# Patient Record
Sex: Female | Born: 1965 | Race: Black or African American | Hispanic: No | Marital: Single | Smoking: Never smoker
Health system: Southern US, Community
[De-identification: ages and names within clinical notes are randomized; demographics above are authoritative.]

## PROBLEM LIST (undated history)

## (undated) DIAGNOSIS — I10 Essential (primary) hypertension: Secondary | ICD-10-CM

## (undated) DIAGNOSIS — E785 Hyperlipidemia, unspecified: Secondary | ICD-10-CM

## (undated) HISTORY — DX: Hyperlipidemia, unspecified: E78.5

## (undated) HISTORY — DX: Essential (primary) hypertension: I10

## (undated) HISTORY — PX: ABDOMINAL HYSTERECTOMY: SHX81

---

## 1998-01-17 ENCOUNTER — Other Ambulatory Visit: Admission: RE | Admit: 1998-01-17 | Discharge: 1998-01-17 | Payer: Self-pay | Admitting: Obstetrics and Gynecology

## 1999-03-21 ENCOUNTER — Other Ambulatory Visit: Admission: RE | Admit: 1999-03-21 | Discharge: 1999-03-21 | Payer: Self-pay | Admitting: Obstetrics and Gynecology

## 2000-05-04 ENCOUNTER — Other Ambulatory Visit: Admission: RE | Admit: 2000-05-04 | Discharge: 2000-05-04 | Payer: Self-pay | Admitting: Obstetrics and Gynecology

## 2001-05-25 ENCOUNTER — Other Ambulatory Visit: Admission: RE | Admit: 2001-05-25 | Discharge: 2001-05-25 | Payer: Self-pay | Admitting: Obstetrics and Gynecology

## 2002-06-20 ENCOUNTER — Other Ambulatory Visit: Admission: RE | Admit: 2002-06-20 | Discharge: 2002-06-20 | Payer: Self-pay | Admitting: Obstetrics and Gynecology

## 2003-07-03 ENCOUNTER — Other Ambulatory Visit: Admission: RE | Admit: 2003-07-03 | Discharge: 2003-07-03 | Payer: Self-pay | Admitting: Obstetrics and Gynecology

## 2004-07-15 ENCOUNTER — Other Ambulatory Visit: Admission: RE | Admit: 2004-07-15 | Discharge: 2004-07-15 | Payer: Self-pay | Admitting: Obstetrics and Gynecology

## 2008-06-28 ENCOUNTER — Ambulatory Visit: Payer: Self-pay | Admitting: Diagnostic Radiology

## 2008-06-28 ENCOUNTER — Ambulatory Visit (HOSPITAL_BASED_OUTPATIENT_CLINIC_OR_DEPARTMENT_OTHER): Admission: RE | Admit: 2008-06-28 | Discharge: 2008-06-28 | Payer: Self-pay | Admitting: Family Medicine

## 2010-04-14 HISTORY — PX: ABDOMINAL HYSTERECTOMY: SHX81

## 2010-07-23 IMAGING — CR DG CHEST 2V
2 series · 2 of 2 positions shown · non-contrast
Comparison: None

CLINICAL DATA: Chest pain.  Wheezing.  Chest tightness.  Cough.

CHEST - 2 VIEW

[w chest pa]
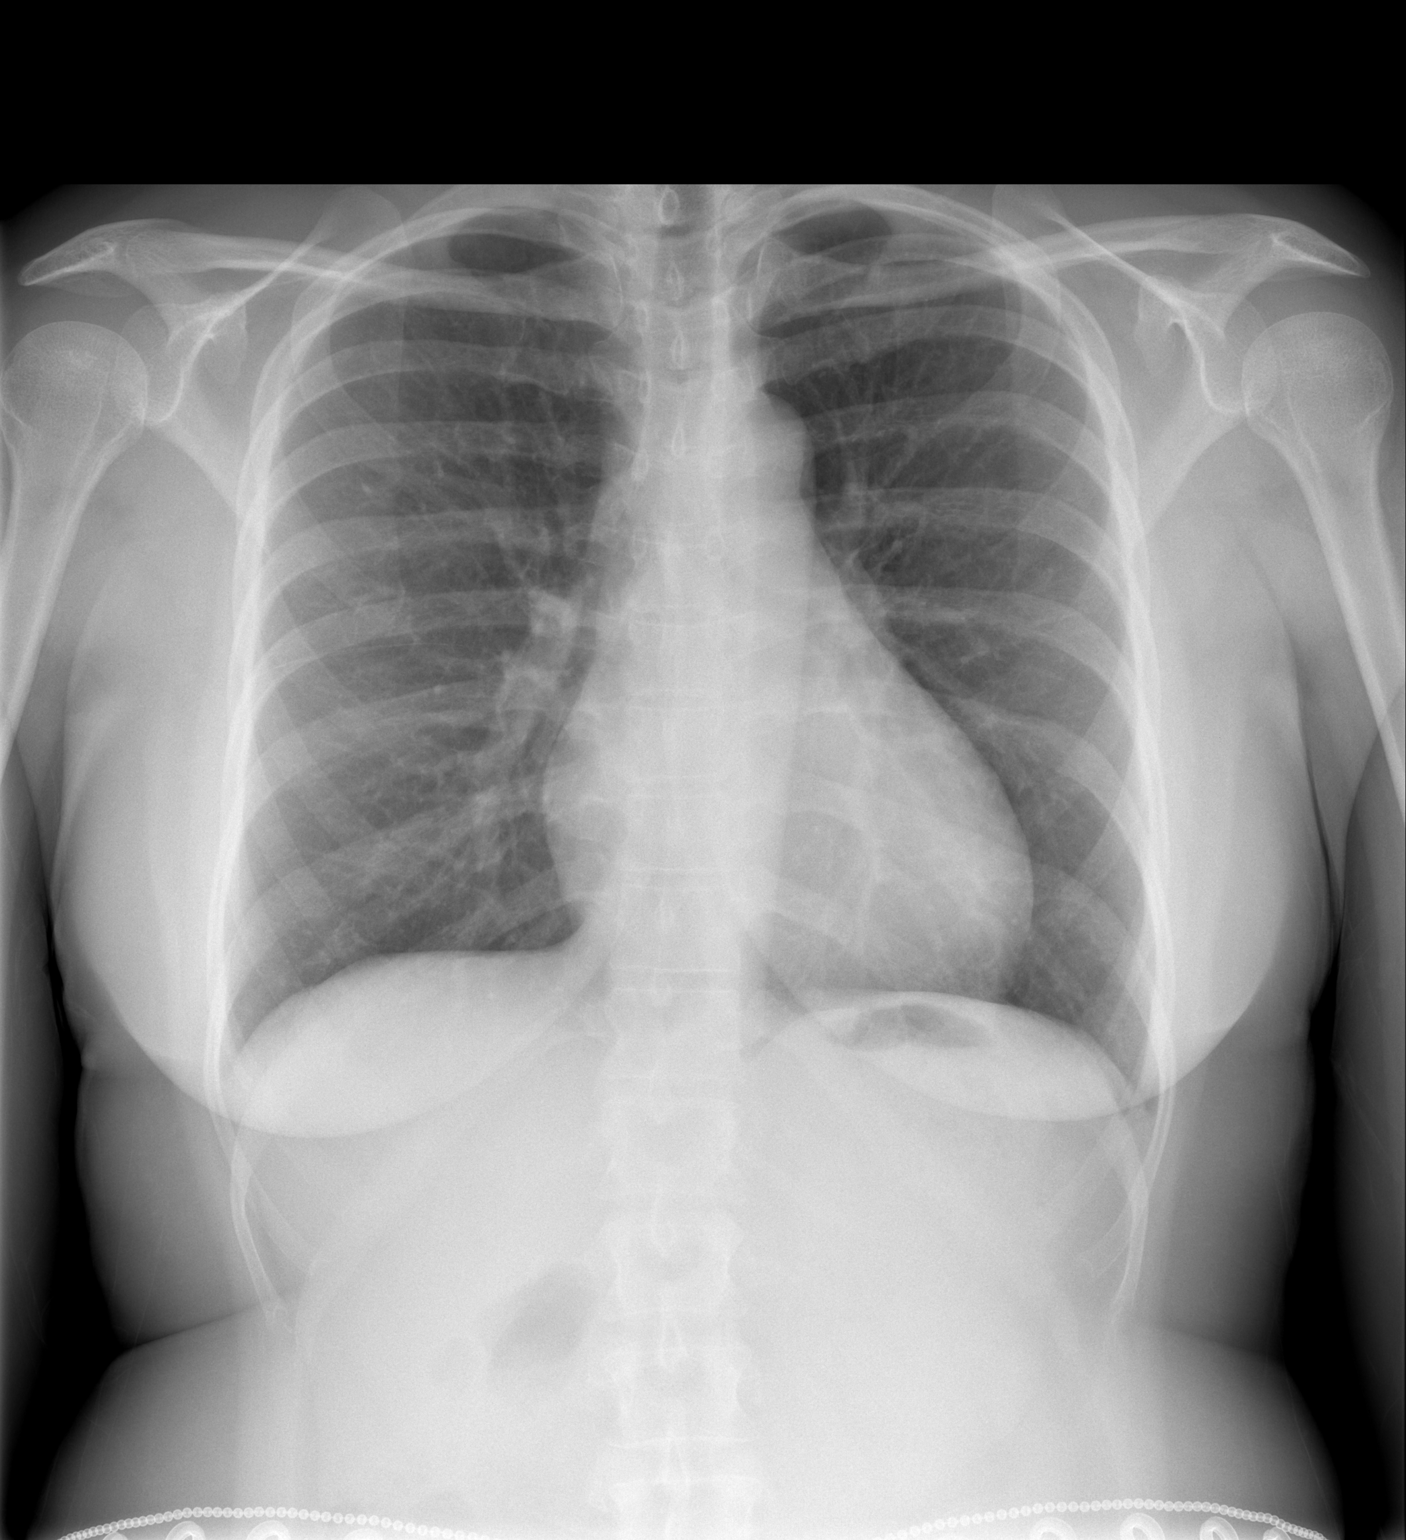

[w chest lat]
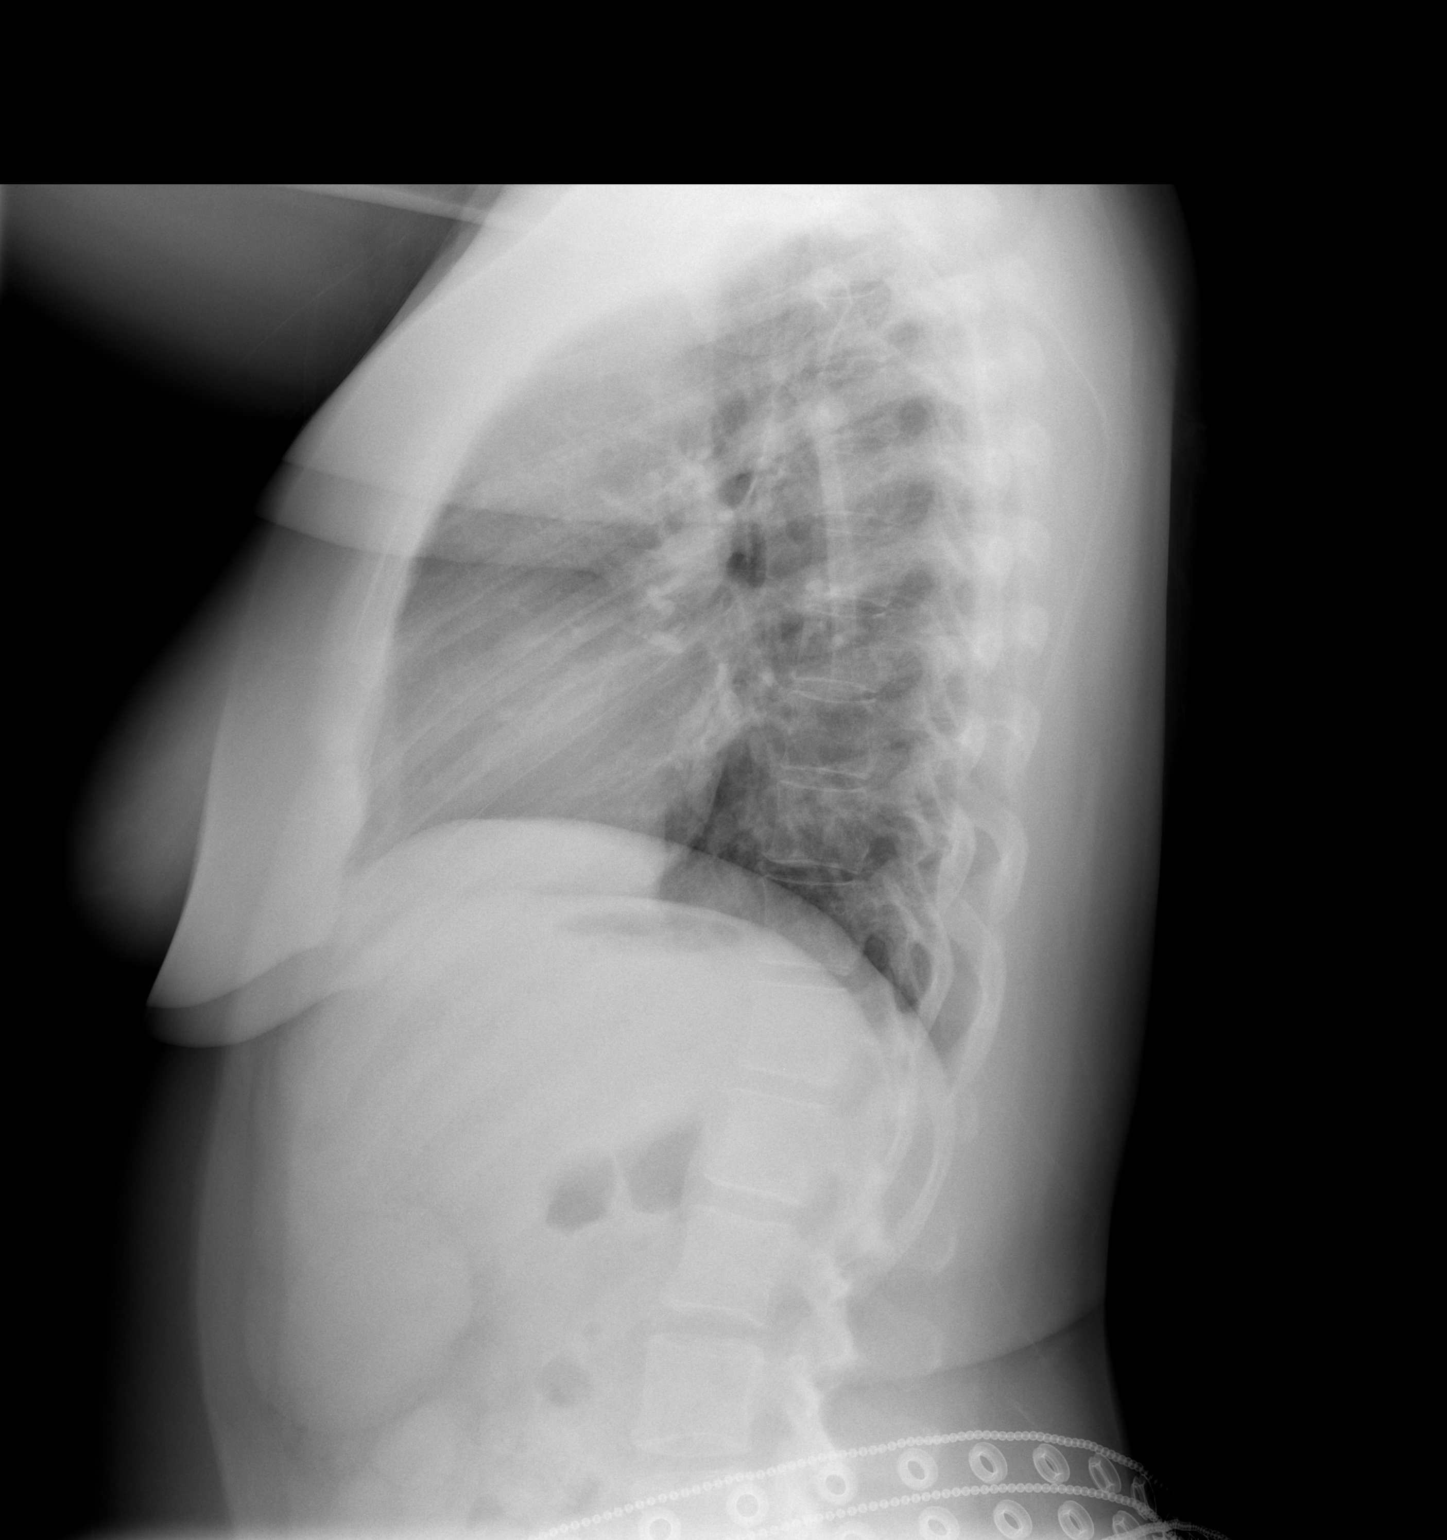

[2 of 2 positions shown; findings below may reference images not displayed]

FINDINGS: Heart size is normal.  The mediastinum is unremarkable.
The lungs are clear.  No effusions.  No bony abnormalities.  No
definite bronchial thickening.
IMPRESSION: Within normal limits.

## 2010-07-25 ENCOUNTER — Encounter (HOSPITAL_COMMUNITY)
Admission: RE | Admit: 2010-07-25 | Discharge: 2010-07-25 | Disposition: A | Discharge status: Home / Self Care | Payer: 59 | Source: Ambulatory Visit | Attending: Obstetrics and Gynecology | Admitting: Obstetrics and Gynecology

## 2010-07-25 DIAGNOSIS — Z01818 Encounter for other preprocedural examination: Secondary | ICD-10-CM | POA: Insufficient documentation

## 2010-07-25 DIAGNOSIS — Z01812 Encounter for preprocedural laboratory examination: Secondary | ICD-10-CM | POA: Insufficient documentation

## 2010-07-25 LAB — SURGICAL PCR SCREEN
MRSA, PCR: NEGATIVE
Staphylococcus aureus: NEGATIVE

## 2010-07-25 LAB — CBC
MCH: 31.4 pg (ref 26.0–34.0)
MCHC: 33.6 g/dL (ref 30.0–36.0)
RDW: 13.2 % (ref 11.5–15.5)

## 2010-08-01 ENCOUNTER — Other Ambulatory Visit: Payer: Self-pay | Admitting: Obstetrics and Gynecology

## 2010-08-01 ENCOUNTER — Inpatient Hospital Stay (HOSPITAL_COMMUNITY)
Admission: RE | Admit: 2010-08-01 | Discharge: 2010-08-03 | DRG: 743 | Disposition: A | Discharge status: Home / Self Care | Payer: 59 | Source: Ambulatory Visit | Attending: Obstetrics and Gynecology | Admitting: Obstetrics and Gynecology

## 2010-08-01 DIAGNOSIS — D252 Subserosal leiomyoma of uterus: Secondary | ICD-10-CM | POA: Diagnosis present

## 2010-08-01 DIAGNOSIS — N920 Excessive and frequent menstruation with regular cycle: Secondary | ICD-10-CM | POA: Diagnosis present

## 2010-08-01 DIAGNOSIS — Z01818 Encounter for other preprocedural examination: Secondary | ICD-10-CM

## 2010-08-01 DIAGNOSIS — Z5331 Laparoscopic surgical procedure converted to open procedure: Secondary | ICD-10-CM

## 2010-08-01 DIAGNOSIS — N949 Unspecified condition associated with female genital organs and menstrual cycle: Secondary | ICD-10-CM | POA: Diagnosis present

## 2010-08-01 DIAGNOSIS — D251 Intramural leiomyoma of uterus: Secondary | ICD-10-CM | POA: Diagnosis present

## 2010-08-01 DIAGNOSIS — D250 Submucous leiomyoma of uterus: Principal | ICD-10-CM | POA: Diagnosis present

## 2010-08-02 LAB — CBC
MCH: 31 pg (ref 26.0–34.0)
MCHC: 32.8 g/dL (ref 30.0–36.0)
MCV: 94.7 fL (ref 78.0–100.0)
Platelets: 206 10*3/uL (ref 150–400)

## 2010-08-21 NOTE — Discharge Summary (Signed)
  [name], [name]             ACCOUNT NO.:  [id number]  MEDICAL RECORD NO.:  [id number]           PATIENT TYPE:  I  LOCATION:  9315                          [facility]ACILITY:  [facility]  PHYSICIAN:  [name], M.D.DATE OF BIRTH:  09-Jul-1965  DATE OF ADMISSION:  08/01/2010 DATE OF DISCHARGE:  08/03/2010                              DISCHARGE SUMMARY   DISCHARGE DIAGNOSES: 1. Symptomatic leiomyoma. 2. Laparoscopy followed by total abdominal hysterectomy this     admission.  SUMMARY OF THE HISTORY AND PHYSICAL EXAMINATION:  Please see admission history for details.  Briefly, a 45 year old with symptomatic fibroids presents for hysterectomy.  HOSPITAL COURSE:  On August 01, 2010, under general anesthesia, the patient underwent first diagnostic laparoscopy followed by laparotomy with TAH.  Both ovaries were conserved.  EBL was minimal.  On the first postop day, hemoglobin 11.7.  She was afebrile.  Her diet was advanced and she was ambulating without difficulty.  By the second postop day, August 03, 2010, she was afebrile.  Both On-Q catheters removed intact x2.  The incision was clean and dry, minimal bruising.  Abdominal exam was unremarkable.  She was voiding without difficulty and was ready for discharge at that point.  LAB DATA:  Preop CBC; WBC 6.8, hemoglobin 13.6, hematocrit 40.5.  Postop CBC on August 02, 2010, WBC 11.0, hemoglobin 11.7, hematocrit 35.7.  An UPT was negative.  DISPOSITION:  The patient was discharged on Tylox p.r.n. pain, Motrin p.r.n. pain.  He will return to the office in 7-10 days.  Advised to report any incisional redness or drainage, increased pain or bleeding, or fever over 101.  She was given specific instructions regarding diet, sex, exercise.  CONDITION:  Good.  ACTIVITY:  Graded increase.     [name], M.D.     RMH/MEDQ  D:  08/03/2010  T:  08/03/2010  Job:  [id number]  Electronically Signed by [name] M.D. on 08/21/2010  09:25:37 AM

## 2010-08-21 NOTE — Op Note (Addendum)
NAMEKEIERA, STRATHMAN             ACCOUNT NO.:  [id number]  MEDICAL RECORD NO.:  [id number]           PATIENT TYPE:  O  LOCATION:  SDC                           [facility]ILITY:  [facility]  PHYSICIAN:  Duke Salvia. Marcelle Overlie, M.D.DATE OF BIRTH:  08/18/1965  DATE OF PROCEDURE:  08/01/2010 DATE OF DISCHARGE:  07/25/2010                              OPERATIVE REPORT   PREOPERATIVE DIAGNOSIS:  Symptomatic leiomyoma.  POSTOPERATIVE DIAGNOSIS:  Symptomatic leiomyoma.  PROCEDURE:  Diagnostic laparoscopy followed by TAH.  SURGEON:  Duke Salvia. Marcelle Overlie, MD  ASSISTANT:  Juluis Mire, MD  ANESTHESIA:  General endotracheal.  COMPLICATIONS:  None.  DRAINS:  Foley catheter.  BLOOD LOSS:  100 mL.  SPECIMENS REMOVED:  Uterus and cervix to Pathology.  PROCEDURE AND FINDINGS:  The patient was taken to the operating room. After adequate level of general endotracheal anesthesia was obtained with the patient's legs in stirrups.  The abdomen, perineum, and vagina were prepped and draped in usual manner for laparoscopy.  The bladder was drained.  A Hulka tenaculum was positioned.  EUA revealed that the uterus was 14-15 weeks size.  Attention was directed to the abdomen where a 2-cm subumbilical incision was made after infiltrated with 0.25% Marcaine plain.  The Veress needle was introduced without difficulty. Its intra-abdominal position was verified by pressure and water testing. After 2.5 liter pneumoperitoneum was then created, laparoscopic trocar and sleeve were then introduced without difficulty.  With the patient in Trendelenburg, a large irregular shaped uterus was noted, could not get a good view of the cul-de-sac which was free and clear but could not get a good view of the vessels laterally.  Due to the size of the uterus, decision was made proceed with TAH.  Legs were placed down, Foley catheter was positioned.  Hulka tenaculum was removed.  Pfannenstiel incision was made, carried down to the  fascia which was incised and extended transversely.  Rectus muscle divided in midline.  Peritoneum entered superiorly without incident and extended vertically.  Without a retractor, the large uterus was delivered through the incision.  Long Kelly clamps were then placed at each utero-ovarian pedicle.  Both ovaries were normal and were conserved.  Starting on the left, the round ligament was coagulated and divided.  The peritoneum was carried around the midline.  The same was repeated on the opposite side.  The bladder was bluntly and sharply dissected below.  The LigaSure handheld device was then used to coagulate and divide the utero-ovarian pedicle. Uterine vasculature pedicle on each side was then skeletonized, clamped, divided, and suture-ligated with 0 Vicryl suture.  Assuring that the bladder was well below, the cardinal ligament, uterosacral ligament, and cervicovaginal pedicles were clamped, divided, and suture-ligated with 0 Vicryl.  The specimen was removed.  Cuff closed with figure-of-eight interrupted 0 Vicryl sutures.  The pelvis was irrigated and noted be hemostatic.  Again both ovaries were conserved.  The appendix was inspected noted be normal prior to closure.  Sponge, needle, and instrument counts were reported as correct x2.  Perineum was closed with running 2-0 Vicryl suture.  A 0 PDS was then used to close  the fascia from laterally to midline on either side.  Prior to closing the fascia, a 6-inch needle introducer was then used to position a catheter in the subfascial space and a second used to position the catheter in the subcutaneous space.  After the fascia was closed, the subcutaneous tissue was irrigated and noted to be hemostatic, 4-0 Monocryl subcuticular suture.  The On-Q catheters were loaded with 45 mL of 1% Xylocaine at each site.  She tolerated this well, went to recovery room in good condition.     Charleigh Correnti M. Marcelle Overlie, M.D.     RMH/MEDQ  D:   08/01/2010  T:  08/01/2010  Job:  [id number]  Electronically Signed by Richarda Overlie M.D. on 09/04/2010 01:48:14 PM

## 2010-08-21 NOTE — H&P (Signed)
  NAMEMARCIEL, [name]             ACCOUNT NO.:  [id number]  MEDICAL RECORD NO.:  [id number]          PATIENT TYPE:  LOCATION:                                 FACILITY:  PHYSICIAN:  Duke Salvia. Marcelle Overlie, M.D.    DATE OF BIRTH:  DATE OF ADMISSION: DATE OF DISCHARGE:                             HISTORY & PHYSICAL   Date of scheduled surgery August 01, 2010.  CHIEF COMPLAINT:  Symptomatic leiomyoma.  HISTORY OF PRESENT ILLNESS:  A 45 year old G3, P1, one prior vaginal delivery, on Ortho-Tri-Cyclen Lo who has had menorrhagia and pelvic pain secondary to the known leiomyoma, presents now for definitive hysterectomy.  Additionally, she has had SUI and has had appropriate urodynamics performed that did demonstrate SUI, but she prefers to not have corrective sling surgery at this time.  Recent ultrasound dated July 24, 2010, showed multiple fibroids the largest 5.9, 4.5, 2.9, at 1.4 cm.  Adnexa unremarkable.  On exam, her uterus is felt to be 12-week size.  The procedure of LAVH including risks related to bleeding, infection, transfusion, adjacent organ injury, possible need to complete surgery by open technique along with her expected recovery time all reviewed with her all of her questions were answered and she accepts.  PAST MEDICAL HISTORY:  Allergies none.  CURRENT MEDICATIONS:  Ortho-Tri-Cyclen Lo.  OBSTETRICAL HISTORY:  One vaginal delivery in 1996.  REVIEW OF SYSTEMS:  Significant for headache.  FAMILY HISTORY:  Significant for kidney disease and hypertension.  SOCIAL HISTORY:  Denies tobacco or drug use, occasional alcohol use. She is single.  Last Pap October 26, 2009, was normal.  PHYSICAL EXAMINATION:  VITAL SIGNS:  Temperature 98.2, blood pressure 128/82. HEENT: Unremarkable. NECK:  Supple without masses. LUNGS:  Clear. CARDIOVASCULAR:  Regular rate and rhythm without murmurs, rubs, or gallops. BREASTS:  Without masses. ABDOMEN:  Soft, flat, and  nontender. PELVIC:  Normal external genitalia.  Vagina and cervix are clear. Uterus 12-week size, mobile adnexa negative.  EXTREMITIES: Unremarkable. NEUROLOGIC:  Unremarkable.  IMPRESSION:  Symptomatic leiomyoma.  PLAN:  LAVH, possible TAH.  Procedure and risks reviewed as above.     Jsiah Menta M. Marcelle Overlie, M.D.     RMH/MEDQ  D:  07/24/2010  T:  07/25/2010  Job:  [id number]  Electronically Signed by Richarda Overlie M.D. on 08/21/2010 09:25:41 AM

## 2016-04-06 ENCOUNTER — Encounter (HOSPITAL_BASED_OUTPATIENT_CLINIC_OR_DEPARTMENT_OTHER): Payer: Self-pay | Admitting: *Deleted

## 2016-04-06 ENCOUNTER — Emergency Department (HOSPITAL_BASED_OUTPATIENT_CLINIC_OR_DEPARTMENT_OTHER)
Admission: EM | Admit: 2016-04-06 | Discharge: 2016-04-06 | Disposition: A | Discharge status: Home / Self Care | Payer: 59 | Attending: Emergency Medicine | Admitting: Emergency Medicine

## 2016-04-06 DIAGNOSIS — S3992XA Unspecified injury of lower back, initial encounter: Secondary | ICD-10-CM | POA: Diagnosis present

## 2016-04-06 DIAGNOSIS — Y92410 Unspecified street and highway as the place of occurrence of the external cause: Secondary | ICD-10-CM | POA: Diagnosis not present

## 2016-04-06 DIAGNOSIS — Y939 Activity, unspecified: Secondary | ICD-10-CM | POA: Diagnosis not present

## 2016-04-06 DIAGNOSIS — Y999 Unspecified external cause status: Secondary | ICD-10-CM | POA: Diagnosis not present

## 2016-04-06 DIAGNOSIS — M545 Low back pain: Secondary | ICD-10-CM | POA: Diagnosis not present

## 2016-04-06 MED ORDER — METHOCARBAMOL 500 MG PO TABS: 500.0000 mg | ORAL_TABLET | Freq: Two times a day (BID) | ORAL | 0 refills | Status: DC

## 2016-04-06 MED ORDER — LIDOCAINE 5 % EX PTCH: 1.0000 | MEDICATED_PATCH | CUTANEOUS | 0 refills | Status: DC

## 2016-04-06 MED ORDER — NAPROXEN 500 MG PO TABS: 500.0000 mg | ORAL_TABLET | Freq: Two times a day (BID) | ORAL | 0 refills | Status: DC

## 2016-04-06 NOTE — ED Notes (Signed)
Pt verbalized understanding of discharge instructions and denies any further questions at this time.   
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
## 2016-04-06 NOTE — Discharge Instructions (Signed)
Expect your soreness to increase over the next 2-3 days. Take it easy, but do not lay around too much as this may make any stiffness worse. Take 500 mg of naproxen every 12 hours or 800 mg of ibuprofen every 8 hours for the next 3 days. Take these medications with food to avoid upset stomach. Robaxin is a muscle relaxer and may help loosen stiff muscles. Do not take the Robaxin while driving or performing other dangerous activities. Be sure to perform the attached exercises starting with three times a week and working up to performing them daily. This is an essential part of preventing long term problems. Follow up with a primary care provider for any future management of these complaints.
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
## 2016-04-06 NOTE — ED Notes (Signed)
ED Provider at bedside. 

## 2016-05-29 LAB — HM COLONOSCOPY

## 2022-04-03 NOTE — Progress Notes (Signed)
Cardiology Office Note   Date:  04/04/2022   ID:  [name], DOB 04/08/1966, MRN [id number]  PCP:  Patient, No Pcp Per  Cardiologist:   None Referring:  Robyne Peers, MD  Chief Complaint  Patient presents with   Chest Pain      History of Present Illness: [name] is a 56 y.o. female who presents for evaluation of chest discomfort.  She is referred by Robyne Peers, MD.   In 2021 she had a negative stress echo with [facility].  This was done to evaluate SOB.  She otherwise does not have any cardiac history.  She said that she has been getting some chest discomfort.  This is happening sporadically.  It happens at rest.  It is right upper or left upper chest.  There does not seem to be any radiation.  Seems to be mild and comes and goes spontaneously.  It is not positional with inspiration.  There are no associated symptoms such as cough fevers or chills.  She has had no nausea vomiting or diaphoresis with this.  It is an aching discomfort.  She thinks it is relatively stable pattern.  She does some strength training a couple times a week and does not bring this on.  She does not do a lot of aerobic activity although she goes up and down stairs in her house every day.  She did previously have shortness of breath as mentioned above but she does not think this is a significant problem.  She is not getting any resting shortness of breath, PND or orthopnea.  She has not had any new palpitations, presyncope or syncope.   Past Medical History:  Diagnosis Date   Dyslipidemia    Hypertension     Past Surgical History:  Procedure Laterality Date   ABDOMINAL HYSTERECTOMY       Current Outpatient Medications  Medication Sig Dispense Refill   amLODipine (NORVASC) 2.5 MG tablet Take 2.5 mg by mouth daily.     atorvastatin (LIPITOR) 20 MG tablet Take 20 mg by mouth daily.     Cholecalciferol (VITAMIN D-3) 25 MCG (1000 UT) CAPS Take 1,000 Units by mouth daily.      TURMERIC PO Take by mouth.     No current facility-administered medications for this visit.    Allergies:   Patient has no known allergies.    Social History:  The patient  reports that she has never smoked. She has never used smokeless tobacco. She reports current alcohol use. She reports that she does not use drugs.   Family History:  The patient's family history includes Atrial fibrillation in her mother; Cancer in her brother; Hypertension in her sister; Kidney disease in her son; Multiple myeloma in her father; Rheum arthritis in her sister.    ROS:  Please see the history of present illness.   Otherwise, review of systems are positive for none.   All other systems are reviewed and negative.    PHYSICAL EXAM: VS:  BP 134/80 (BP Location: Left Arm, Patient Position: Sitting, Cuff Size: Large)   Pulse 90   Ht _0  (1.575 m)   Wt 197 lb (89.4 kg)   BMI 36.03 kg/m  , BMI Body mass index is 36.03 kg/m. GENERAL:  Well appearing HEENT:  Pupils equal round and reactive, fundi not visualized, oral mucosa unremarkable NECK:  No jugular venous distention, waveform within normal limits, carotid upstroke brisk and symmetric, no bruits, no thyromegaly  LYMPHATICS:  No cervical, inguinal adenopathy LUNGS:  Clear to auscultation bilaterally BACK:  No CVA tenderness CHEST:  Unremarkable HEART:  PMI not displaced or sustained,S1 and S2 within normal limits, no S3, no S4, no clicks, no rubs, no murmurs ABD:  Flat, positive bowel sounds normal in frequency in pitch, no bruits, no rebound, no guarding, no midline pulsatile mass, no hepatomegaly, no splenomegaly EXT:  2 plus pulses throughout, no edema, no cyanosis no clubbing SKIN:  No rashes no nodules NEURO:  Cranial nerves II through XII grossly intact, motor grossly intact throughout PSYCH:  Cognitively intact, oriented to person place and time    EKG:  EKG is ordered today. The ekg ordered today demonstrates sinus rhythm, rate 90, axis  within normal limits, intervals within normal limits, poor anterior R wave progression, no acute ST-T wave changes.   Recent Labs: No results found for requested labs within last 365 days.    Lipid Panel No results found for: "CHOL", "TRIG", "HDL", "CHOLHDL", "VLDL", "LDLCALC", "LDLDIRECT"    Wt Readings from Last 3 Encounters:  04/04/22 197 lb (89.4 kg)  04/06/16 184 lb (83.5 kg)      Other studies Reviewed: Additional studies/ records that were reviewed today include: Labs. Review of the above records demonstrates:  Please see elsewhere in the note.     ASSESSMENT AND PLAN:  Chest pain: This has nonanginal greater than anginal features.  I am going to screen her with a POET (Plain Old Exercise Treadmill).  Dyslipidemia: Her LDL was 70 and HDL 72.  Goals of therapy will be based on her coronary calcium score and stress test results.  Risk reduction: Would like for her to get a coronary calcium score.  We talked about diet.  We talked about exercise.     Current medicines are reviewed at length with the patient today.  The patient does not have concerns regarding medicines.  The following changes have been made:  no change  Labs/ tests ordered today include:   Orders Placed This Encounter  Procedures   CT CARDIAC SCORING (SELF PAY ONLY)   Exercise Tolerance Test   EKG 12-Lead     Disposition:   FU with     Signed, Minus Breeding, MD  04/04/2022 3:10 PM    [place]

## 2022-04-04 ENCOUNTER — Ambulatory Visit: Payer: 59 | Attending: Cardiology | Admitting: Cardiology

## 2022-04-04 ENCOUNTER — Encounter: Payer: Self-pay | Admitting: Cardiology

## 2022-04-04 VITALS — BP 134/80 | HR 90 | Ht 62.0 in | Wt 197.0 lb

## 2022-04-04 DIAGNOSIS — R0602 Shortness of breath: Secondary | ICD-10-CM

## 2022-04-04 DIAGNOSIS — R072 Precordial pain: Secondary | ICD-10-CM

## 2022-04-04 NOTE — Patient Instructions (Signed)
Medication Instructions:  Continue same medications   Lab Work: None ordered   Testing/Procedures: Schedule Treadmill  ( POET )  Coronary Calcium Score   Follow-Up: At [facility], you and your health needs are our priority.  As part of our continuing mission to provide you with exceptional heart care, we have created designated Provider Care Teams.  These Care Teams include your primary Cardiologist (physician) and Advanced Practice Providers (APPs -  Physician Assistants and Nurse Practitioners) who all work together to provide you with the care you need, when you need it.  We recommend signing up for the patient portal called "MyChart".  Sign up information is provided on this After Visit Summary.  MyChart is used to connect with patients for Virtual Visits (Telemedicine).  Patients are able to view lab/test results, encounter notes, upcoming appointments, etc.  Non-urgent messages can be sent to your provider as well.   To learn more about what you can do with MyChart, go to [email or web address].    Your next appointment:  As Needed    The format for your next appointment: Office   Provider:  Dr.Hochrein   Important Information About Sugar

## 2022-04-04 NOTE — Addendum Note (Signed)
Addended by: Kathyrn Lass on: 04/04/2022 04:03 PM   Modules accepted: Orders

## 2022-04-11 NOTE — Addendum Note (Signed)
Addended by: Minus Breeding on: 04/11/2022 11:41 AM   Modules accepted: Orders

## 2022-05-20 ENCOUNTER — Ambulatory Visit (HOSPITAL_BASED_OUTPATIENT_CLINIC_OR_DEPARTMENT_OTHER)
Admission: RE | Admit: 2022-05-20 | Discharge: 2022-05-20 | Disposition: A | Discharge status: Home / Self Care | Payer: Managed Care, Other (non HMO) | Source: Ambulatory Visit | Attending: Cardiology | Admitting: Cardiology

## 2022-05-20 ENCOUNTER — Encounter (HOSPITAL_BASED_OUTPATIENT_CLINIC_OR_DEPARTMENT_OTHER): Payer: Self-pay

## 2022-05-20 ENCOUNTER — Encounter (HOSPITAL_BASED_OUTPATIENT_CLINIC_OR_DEPARTMENT_OTHER): Payer: 59

## 2022-05-20 DIAGNOSIS — R0602 Shortness of breath: Secondary | ICD-10-CM | POA: Insufficient documentation

## 2022-05-22 ENCOUNTER — Ambulatory Visit: Payer: Managed Care, Other (non HMO) | Attending: Cardiology

## 2022-05-22 DIAGNOSIS — R0602 Shortness of breath: Secondary | ICD-10-CM | POA: Diagnosis not present

## 2022-05-22 LAB — EXERCISE TOLERANCE TEST
Angina Index: 0
Duke Treadmill Score: 4
Estimated workload: 5.8
Exercise duration (min): 4 min
Exercise duration (sec): 0 s
MPHR: 164 {beats}/min
Peak HR: 169 {beats}/min
Percent HR: 103 %
RPE: 14
Rest HR: 90 {beats}/min
ST Depression (mm): 0 mm

## 2022-08-06 ENCOUNTER — Other Ambulatory Visit: Payer: Self-pay | Admitting: Family Medicine

## 2022-08-06 DIAGNOSIS — Z0000 Encounter for general adult medical examination without abnormal findings: Secondary | ICD-10-CM

## 2022-08-19 ENCOUNTER — Ambulatory Visit
Admission: RE | Admit: 2022-08-19 | Discharge: 2022-08-19 | Disposition: A | Discharge status: Home / Self Care | Payer: Managed Care, Other (non HMO) | Source: Ambulatory Visit | Attending: Family Medicine | Admitting: Family Medicine

## 2022-08-19 DIAGNOSIS — Z0000 Encounter for general adult medical examination without abnormal findings: Secondary | ICD-10-CM

## 2023-10-09 ENCOUNTER — Other Ambulatory Visit: Payer: Self-pay | Admitting: Family Medicine

## 2023-10-09 NOTE — Telephone Encounter (Signed)
 Copied from CRM [id number]. Topic: Clinical - Medication Refill >> Oct 09, 2023  4:10 PM Sophia H wrote: Medication: atorvastatin (LIPITOR) 20 MG tablet amLODipine (NORVASC) 2.5 MG tablet  Has the patient contacted their pharmacy? Yes, told out of fills (previous patient of Dr. Aletha from old practice)  This is the patient's preferred pharmacy:  [facility] [phone] - [place], [place] - [address] [place] [address] Phone: [phone] Fax: [phone]  Is this the correct pharmacy for this prescription? Yes If no, delete pharmacy and type the correct one.   Has the prescription been filled recently? No  Is the patient out of the medication? Yes  Has the patient been seen for an appointment in the last year OR does the patient have an upcoming appointment? Yes, establish care at [facility] Oct 28, needs refills to last till then  Can we respond through MyChart? No, prefers phone call   Agent: Please be advised that Rx refills may take up to 3 business days. We ask that you follow-up with your pharmacy.

## 2023-10-12 MED ORDER — AMLODIPINE BESYLATE 2.5 MG PO TABS: 2.5000 mg | ORAL_TABLET | Freq: Every day | ORAL | 0 refills | Status: DC

## 2023-10-12 MED ORDER — ATORVASTATIN CALCIUM 20 MG PO TABS: 20.0000 mg | ORAL_TABLET | Freq: Every day | ORAL | 0 refills | Status: DC

## 2023-10-12 NOTE — Telephone Encounter (Signed)
 Requested medication (s) are due for refill today -unsure  Requested medication (s) are on the active medication list -yes  Future visit scheduled yes  Last refill: unsure  Notes to clinic: medications listed as historical, fails lab protocol- over 1 year  Requested Prescriptions  Pending Prescriptions Disp Refills   amLODipine (NORVASC) 2.5 MG tablet      Sig: Take 1 tablet (2.5 mg total) by mouth daily.     Cardiovascular: Calcium Channel Blockers 2 Failed - 10/12/2023  2:17 PM      Failed - Valid encounter within last 6 months    Recent Outpatient Visits   None            Passed - Last BP in normal range    BP Readings from Last 1 Encounters:  04/04/22 134/80         Passed - Last Heart Rate in normal range    Pulse Readings from Last 1 Encounters:  04/04/22 90          atorvastatin (LIPITOR) 20 MG tablet      Sig: Take 1 tablet (20 mg total) by mouth daily.     Cardiovascular:  Antilipid - Statins Failed - 10/12/2023  2:17 PM      Failed - Valid encounter within last 12 months    Recent Outpatient Visits   None            Failed - Lipid Panel in normal range within the last 12 months    No results found for: CHOL, POCCHOL, CHOLTOT No results found for: LDLCALC, LDLC, HIRISKLDL, POCLDL, LDLDIRECT, REALLDLC, TOTLDLC No results found for: HDL, POCHDL No results found for: TRIG, POCTRIG       Passed - Patient is not pregnant         Requested Prescriptions  Pending Prescriptions Disp Refills   amLODipine (NORVASC) 2.5 MG tablet      Sig: Take 1 tablet (2.5 mg total) by mouth daily.     Cardiovascular: Calcium Channel Blockers 2 Failed - 10/12/2023  2:17 PM      Failed - Valid encounter within last 6 months    Recent Outpatient Visits   None            Passed - Last BP in normal range    BP Readings from Last 1 Encounters:  04/04/22 134/80         Passed - Last Heart Rate in normal range    Pulse Readings from  Last 1 Encounters:  04/04/22 90          atorvastatin (LIPITOR) 20 MG tablet      Sig: Take 1 tablet (20 mg total) by mouth daily.     Cardiovascular:  Antilipid - Statins Failed - 10/12/2023  2:17 PM      Failed - Valid encounter within last 12 months    Recent Outpatient Visits   None            Failed - Lipid Panel in normal range within the last 12 months    No results found for: CHOL, POCCHOL, CHOLTOT No results found for: LDLCALC, LDLC, HIRISKLDL, POCLDL, LDLDIRECT, REALLDLC, TOTLDLC No results found for: HDL, POCHDL No results found for: TRIG, POCTRIG       Passed - Patient is not pregnant

## 2024-01-09 ENCOUNTER — Other Ambulatory Visit: Payer: Self-pay | Admitting: Family Medicine

## 2024-01-12 NOTE — Telephone Encounter (Signed)
 Requested Prescriptions  Pending Prescriptions Disp Refills   amLODipine  (NORVASC ) 2.5 MG tablet [Pharmacy Med Name: AMLODIPINE  BESYLATE 2.5 MG TAB] 90 tablet 0    Sig: TAKE 1 TABLET BY MOUTH EVERY DAY     Cardiovascular: Calcium  Channel Blockers 2 Failed - 01/12/2024  9:14 AM      Failed - Valid encounter within last 6 months    Recent Outpatient Visits   None            Passed - Last BP in normal range    BP Readings from Last 1 Encounters:  04/04/22 134/80         Passed - Last Heart Rate in normal range    Pulse Readings from Last 1 Encounters:  04/04/22 90          atorvastatin  (LIPITOR) 20 MG tablet [Pharmacy Med Name: ATORVASTATIN  20 MG TABLET] 90 tablet 0    Sig: TAKE 1 TABLET BY MOUTH EVERY DAY     Cardiovascular:  Antilipid - Statins Failed - 01/12/2024  9:14 AM      Failed - Valid encounter within last 12 months    Recent Outpatient Visits   None            Failed - Lipid Panel in normal range within the last 12 months    No results found for: CHOL, POCCHOL, CHOLTOT No results found for: LDLCALC, LDLC, HIRISKLDL, POCLDL, LDLDIRECT, REALLDLC, TOTLDLC No results found for: HDL, POCHDL No results found for: TRIG, POCTRIG       Passed - Patient is not pregnant

## 2024-02-10 ENCOUNTER — Encounter: Payer: Self-pay | Admitting: Family Medicine

## 2024-02-10 ENCOUNTER — Ambulatory Visit: Admitting: Family Medicine

## 2024-02-10 VITALS — BP 136/90 | HR 80 | Temp 98.3°F | Ht 62.0 in | Wt 206.0 lb

## 2024-02-10 DIAGNOSIS — Z0000 Encounter for general adult medical examination without abnormal findings: Secondary | ICD-10-CM

## 2024-02-10 DIAGNOSIS — Z6837 Body mass index (BMI) 37.0-37.9, adult: Secondary | ICD-10-CM | POA: Diagnosis not present

## 2024-02-10 DIAGNOSIS — M549 Dorsalgia, unspecified: Secondary | ICD-10-CM | POA: Diagnosis not present

## 2024-02-10 DIAGNOSIS — Z0001 Encounter for general adult medical examination with abnormal findings: Secondary | ICD-10-CM

## 2024-02-10 DIAGNOSIS — E785 Hyperlipidemia, unspecified: Secondary | ICD-10-CM

## 2024-02-10 DIAGNOSIS — Z7689 Persons encountering health services in other specified circumstances: Secondary | ICD-10-CM

## 2024-02-10 DIAGNOSIS — I10 Essential (primary) hypertension: Secondary | ICD-10-CM | POA: Diagnosis not present

## 2024-02-10 MED ORDER — WEGOVY 0.25 MG/0.5ML ~~LOC~~ SOAJ: 0.2500 mg | SUBCUTANEOUS | 1 refills | Status: DC

## 2024-02-10 NOTE — Progress Notes (Signed)
 Complete physical exam  Assessment & Plan:    Routine Health Maintenance and Physical Exam Discussed health benefits of physical activity, and encouraged her to engage in regular exercise appropriate for her age and condition.  Preventative health care -     CBC -     TSH -     Lipid panel -     Comprehensive metabolic panel with GFR  Hyperlipidemia, unspecified hyperlipidemia type -     Lipid panel  Hypertension, unspecified type -     CBC -     Comprehensive metabolic panel with GFR -     EKG 12-Lead  Encounter to establish care  Upper back pain  BMI 37.0-37.9, adult -     Tzhncb; Inject 0.25 mg into the skin once a week.  Dispense: 2 mL; Refill: 1   Test results were reviewed and analyzed as part of the medical decision making of this visit.  Reviewed previous notes from [facility] during the office visit.  EKG-normal sinus rhythm, no acute changes noted.  Patient did have occasional PVCs.  This was compared to previous EKG done at [facility] last year and no significant change.  Patient is aware of this. Recommend healthy diet i.e mediterranean/DASH diet, consistent exercise - 30 minutes 5 day per week, and gradual weight loss. Sample of Wegovy given to the patient.  Patient is aware potential negative side effects.  Patient knows to increase protein intake while on the [facility] and to do strength training.  Return in 2 to 3 months or sooner if necessary. Return in about 3 months (around 05/12/2024), or if symptoms worsen or fail to improve.        Subjective:  Patient ID: [name], female    DOB: [birth year]  Age: 58 y.o. MRN: [id number] Chief Complaint  Patient presents with   Annual Exam   Establish Care    [name] is a 58 y.o. female who presents today for a complete physical exam. Colonoscopy-UTD DEXA scan-not yet, pap smear per [facility] - negative per patient  Tetanus-UTD Shingrix-per patient got it at [facility] 2022 History  of Present Illness [name] is a 58 year old female who presents for a routine follow-up visit and physical exam  She experiences occasional muscle aches, which she attributes to her workouts at the gym. She is currently taking Lipitor and is unsure if it is contributing to the aches. Her exercise routine includes strength training twice a week and cardio three times a week.  She has intermittent back pain described as a deep, internal pain located in the upper middle back, just above the bra strap area. The pain does not disturb her sleep and is not associated with reflux or indigestion.  Recently, she became aware of a small cyst on her back, which was squeezed and removed at home. She describes it as possibly related to a hair follicle and it has not caused significant pain.  She has a history of a hysterectomy due to fibroids and heavy bleeding, and she does not have a uterus or cervix. Her last Pap smear was in March, and she is up to date with her mammograms and colonoscopies.  She received the shingles vaccine (Shingrix) at [facility] after COVID, and she had two shots. She is up to date with her tetanus vaccine as of 2020.  Her father has a history of multiple myeloma, diagnosed ten years ago, and is doing well with regular oncologist  visits. There is no family history of breast, uterine, ovarian, or colon cancer.  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 The treatments take about four hours each session.  No chest pain, palpitations, reflux, or indigestion. Reports occasional back pain and muscle aches.  Physical Exam NEUROLOGICAL: Sensation intact.  Results RADIOLOGY Mammogram: Normal (06/2023)  DIAGNOSTIC Colonoscopy: No polyps (2018)  Assessment and Plan Adult Wellness Visit Routine wellness visit with health status, family history, and lifestyle review.  Screenings and vaccinations updated. - Continue annual mammograms. - Continue colonoscopy every 10 years. - Document shingles vaccination from [facility]. - Discuss pneumonia vaccination for individuals over 50. - Encourage healthy eating and regular exercise.  Dorsalgia (back pain) Intermittent upper back pain, likely musculoskeletal. Recent cyst removal not related. - Perform EKG to rule out cardiac causes. - Order blood work. - Consider dermatology referral if needed. Reviewed  previous history of chest pain/SOB-patient [facility] and also [facility] Dr. Lavona about one year  ago and had negative stress testing, normal calcium  score of zero and EKG was normal at that time. Obesity Discussed weight management options including Wegovy, potential side effects, and insurance coverage. Emphasized protein intake to prevent muscle loss. - Consider prescribing Wegovy if she decides to proceed.- sample given and Rx sent to pharmacy - Encourage continued strength training and cardio exercise. - Discuss Healthy Weight program as an alternative.  Hyperlipidemia Occasional muscle aches possibly related to Lipitor (but unlikely), no significant impact on activities.  Essential hypertension No changes in hypertension management. Health Maintenance  Topic Date Due   HIV Screening  Never done   Hepatitis C Screening  Never done   Hepatitis B Vaccine (1 of 3 - 19+ 3-dose series) Never done   Pap with HPV screening  Never done   COVID-19 Vaccine (7 - 2025-26 season) 12/14/2023   Zoster (Shingles) Vaccine (1 of 2) 05/12/2024*   Flu Shot  07/12/2024*   Pneumococcal Vaccine for age over 60 (1 of 1 - PCV) 02/09/2025*   Breast Cancer Screening  08/18/2024   Colon Cancer Screening  05/29/2026   DTaP/Tdap/Td vaccine (3 - Td or Tdap) 03/28/2029   HPV Vaccine  Aged Out   Meningitis B Vaccine  Aged Out  *Topic was postponed. The date shown is not the original due date.    Most recent  fall risk assessment:    02/10/2024    9:27 AM  Fall Risk   Falls in the past year? 0     Most recent depression screenings:    02/10/2024    9:27 AM  PHQ 2/9 Scores  PHQ - 2 Score 0      The 10-year ASCVD risk score (Arnett DK, et al., 2019) is: 5.3%   Values used to calculate the score:     Age: 77 years     Clincally relevant sex: Female     Is Non-Hispanic African American: Yes     Diabetic: No     Tobacco smoker: No     Systolic Blood Pressure: 136 mmHg     Is BP treated: Yes     HDL Cholesterol: 62 mg/dL     Total Cholesterol: 155 mg/dL  Past Surgical History:  Procedure Laterality Date   ABDOMINAL HYSTERECTOMY  2012   Social History   Tobacco Use   Smoking status: Never  Smokeless tobacco: Never  Vaping Use   Vaping status: Never Used  Substance Use Topics   Alcohol use: Yes   Drug use: No   Social History   Socioeconomic History   Marital status: Single    Spouse name: Not on file   Number of children: Not on file   Years of education: Not on file   Highest education level: Bachelor's degree (e.g., BA, AB, BS)  Occupational History   Not on file  Tobacco Use   Smoking status: Never   Smokeless tobacco: Never  Vaping Use   Vaping status: Never Used  Substance and Sexual Activity   Alcohol use: Yes   Drug use: No   Sexual activity: Not Currently    Birth control/protection: None  Other Topics Concern   Not on file  Social History Narrative   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     Social Drivers of Corporate Investment Banker Strain: Low Risk  (02/03/2024)   Overall Financial Resource Strain (CARDIA)    Difficulty of Paying Living Expenses: Not hard at all  Food Insecurity: No Food Insecurity (02/03/2024)   Hunger Vital Sign    Worried About Running Out of Food in the Last Year: Never true    Ran Out of Food in the Last Year: Never true  Transportation Needs: No Transportation Needs (02/03/2024)   PRAPARE -  Administrator, Civil Service (Medical): No    Lack of Transportation (Non-Medical): No  Physical Activity: Insufficiently Active (02/03/2024)   Exercise Vital Sign    Days of Exercise per Week: 3 days    Minutes of Exercise per Session: 30 min  Stress: No Stress Concern Present (02/03/2024)   [employer] of Occupational Health - Occupational Stress Questionnaire    Feeling of Stress: Not at all  Social Connections: Moderately Isolated (02/03/2024)   Social Connection and Isolation Panel    Frequency of Communication with Friends and Family: Once a week    Frequency of Social Gatherings with Friends and Family: Once a week    Attends Religious Services: 1 to 4 times per year    Active Member of [employer] or Organizations: No    Attends Banker Meetings: Not on file    Marital Status: Living with partner  Intimate Partner Violence: Not At Risk (02/18/2021)   Received from [facility] visits prior to 06/14/2022.   Humiliation, Afraid, Rape, and Kick questionnaire    Within the last year, have you been afraid of your partner or ex-partner?: No    Within the last year, have you been humiliated or emotionally abused in other ways by your partner or ex-partner?: No    Within the last year, have you been kicked, hit, slapped, or otherwise physically hurt by your partner or ex-partner?: No    Within the last year, have you been raped or forced to have any kind of sexual activity by your partner or ex-partner?: No   Family History  Problem Relation Age of Onset   Atrial fibrillation Mother    Hypertension Mother    Hyperlipidemia Mother    Multiple myeloma Father    Cancer Father    Rheum arthritis Sister    Hypertension Sister    Cancer Brother        Glio   Kidney disease Son        FSGS   Ovarian cancer Neg Hx    Breast  cancer Neg Hx    Colon cancer Neg Hx    Uterine cancer Neg Hx    No Known Allergies   Patient Care Team: Aletha Bene, MD as PCP - General (Family Medicine)   Outpatient Medications Prior to Visit  Medication Sig   amLODipine  (NORVASC ) 2.5 MG tablet TAKE 1 TABLET BY MOUTH EVERY DAY   atorvastatin  (LIPITOR) 20 MG tablet TAKE 1 TABLET BY MOUTH EVERY DAY   Cholecalciferol (VITAMIN D-3) 25 MCG (1000 UT) CAPS Take 1,000 Units by mouth daily.   TURMERIC PO Take by mouth.   No facility-administered medications prior to visit.    ROS     Objective:    BP (!) 136/90   Pulse 80   Temp 98.3 F (36.8 C)   Ht 5' 2 (1.575 m)   Wt 206 lb (93.4 kg)   SpO2 99%   BMI 37.68 kg/m  BP Readings from Last 3 Encounters:  02/10/24 (!) 136/90  04/04/22 134/80  04/06/16 137/85   Wt Readings from Last 3 Encounters:  02/10/24 206 lb (93.4 kg)  04/04/22 197 lb (89.4 kg)  04/06/16 184 lb (83.5 kg)    Physical Exam Vitals and nursing note reviewed.  Constitutional:      General: She is not in acute distress.    Appearance: Normal appearance.  HENT:     Head: Normocephalic.     Right Ear: Tympanic membrane, ear canal and external ear normal.     Left Ear: Tympanic membrane, ear canal and external ear normal.     Nose: Nose normal.     Mouth/Throat:     Mouth: Mucous membranes are moist.     Pharynx: Oropharynx is clear.  Eyes:     Extraocular Movements: Extraocular movements intact.     Conjunctiva/sclera: Conjunctivae normal.     Pupils: Pupils are equal, round, and reactive to light.  Neck:     Thyroid: No thyroid mass, thyromegaly or thyroid tenderness.  Cardiovascular:     Rate and Rhythm: Normal rate and regular rhythm.     Heart sounds: Normal heart sounds.  Pulmonary:     Effort: Pulmonary effort is normal.     Breath sounds: Normal breath sounds.  Chest:  Breasts:    Breasts are symmetrical.     Right: Normal. No inverted nipple, mass or nipple discharge.     Left: Normal. No inverted nipple, mass or nipple discharge.  Abdominal:     General: Bowel sounds are normal.      Palpations: Abdomen is soft.     Tenderness: There is no abdominal tenderness. There is no guarding.  Genitourinary:    Vagina: Normal. No lesions.     Cervix: Normal. No cervical motion tenderness, discharge or lesion.     Uterus: Normal.      Adnexa: Right adnexa normal and left adnexa normal.       Right: No mass or tenderness.         Left: No mass or tenderness.       Rectum: No external hemorrhoid or internal hemorrhoid.  Musculoskeletal:        General: No tenderness. Normal range of motion.     Cervical back: Normal range of motion and neck supple.     Right lower leg: No edema.     Left lower leg: No edema.  Lymphadenopathy:     Upper Body:     Right upper body: No supraclavicular or axillary adenopathy.  Left upper body: No supraclavicular or axillary adenopathy.  Skin:    General: Skin is warm and dry.     Comments: Has previous epidermal inclusion cyst/hair follice with debris which has been removed over mid back area, no atypia seen   Neurological:     General: No focal deficit present.     Mental Status: She is alert and oriented to person, place, and time. Mental status is at baseline.     Cranial Nerves: Cranial nerves 2-12 are intact. No cranial nerve deficit.     Sensory: No sensory deficit.     Motor: No weakness.     Coordination: Coordination is intact. Romberg sign negative. Coordination normal. Finger-Nose-Finger Test normal.     Gait: Gait is intact. Gait normal.     Deep Tendon Reflexes: Reflexes are normal and symmetric. Reflexes normal.  Psychiatric:        Mood and Affect: Mood normal.        Behavior: Behavior normal.        Thought Content: Thought content normal.        Judgment: Judgment normal.      Results for orders placed or performed in visit on 02/10/24  HM COLONOSCOPY  Result Value Ref Range   HM Colonoscopy See Report (in chart) See Report (in chart), Patient Reported        Connie Emperor, MD

## 2024-02-11 LAB — COMPREHENSIVE METABOLIC PANEL WITH GFR
AG Ratio: 1.6 (calc) (ref 1.0–2.5)
ALT: 35 U/L — ABNORMAL HIGH (ref 6–29)
AST: 33 U/L (ref 10–35)
Albumin: 4.6 g/dL (ref 3.6–5.1)
Alkaline phosphatase (APISO): 58 U/L (ref 37–153)
BUN: 13 mg/dL (ref 7–25)
CO2: 27 mmol/L (ref 20–32)
Calcium: 9.7 mg/dL (ref 8.6–10.4)
Chloride: 104 mmol/L (ref 98–110)
Creat: 0.64 mg/dL (ref 0.50–1.03)
Globulin: 2.9 g/dL (ref 1.9–3.7)
Glucose, Bld: 95 mg/dL (ref 65–99)
Potassium: 3.7 mmol/L (ref 3.5–5.3)
Sodium: 140 mmol/L (ref 135–146)
Total Bilirubin: 1.2 mg/dL (ref 0.2–1.2)
Total Protein: 7.5 g/dL (ref 6.1–8.1)
eGFR: 102 mL/min/1.73m2 (ref >=60)

## 2024-02-11 LAB — CBC
HCT: 41.7 % (ref 35.0–45.0)
Hemoglobin: 13.6 g/dL (ref 11.7–15.5)
MCH: 30.6 pg (ref 27.0–33.0)
MCHC: 32.6 g/dL (ref 32.0–36.0)
MCV: 93.7 fL (ref 80.0–100.0)
MPV: 10.8 fL (ref 7.5–12.5)
Platelets: 262 Thousand/uL (ref 140–400)
RBC: 4.45 Million/uL (ref 3.80–5.10)
RDW: 12.5 % (ref 11.0–15.0)
WBC: 7.2 Thousand/uL (ref 3.8–10.8)

## 2024-02-11 LAB — LIPID PANEL
Cholesterol: 154 mg/dL (ref <200)
HDL: 67 mg/dL (ref >=50)
LDL Cholesterol (Calc): 70 mg/dL
Non-HDL Cholesterol (Calc): 87 mg/dL (ref <130)
Total CHOL/HDL Ratio: 2.3 (calc) (ref <5.0)
Triglycerides: 89 mg/dL (ref <150)

## 2024-02-11 LAB — TSH: TSH: 2.3 m[IU]/L (ref 0.40–4.50)

## 2024-02-12 ENCOUNTER — Ambulatory Visit: Payer: Self-pay | Admitting: Family Medicine

## 2024-03-03 ENCOUNTER — Telehealth: Payer: Self-pay | Admitting: Family Medicine

## 2024-03-03 NOTE — Telephone Encounter (Unsigned)
 Copied from CRM [id number]. Topic: Clinical - Medication Question >> Mar 03, 2024  1:58 PM Antwanette L wrote: Reason for CRM: Patient called to inquire whether Dr. Aletha has any samples of semaglutide  for weight management (Wegovy ) 0.25 mg/0.5 mL SOAJ SQ injection.Please follow up with the patient at [phone]

## 2024-03-08 NOTE — Telephone Encounter (Signed)
 Patient states she has called multiple times to see if there are any samples available for the wegovy . Patient is requesting samples of .25 or .50 mg.  Called [facility] to confirm if there any samples of the wegovy  for pick up. Confirmed with [facility] she can pick up 1 sample of .25mg .   Patient will stop by office tomorrow to pick up sample.

## 2024-04-08 ENCOUNTER — Other Ambulatory Visit: Payer: Self-pay | Admitting: Family Medicine

## 2024-04-27 ENCOUNTER — Ambulatory Visit: Admitting: Family Medicine

## 2024-04-27 ENCOUNTER — Encounter: Payer: Self-pay | Admitting: Family Medicine

## 2024-04-27 VITALS — BP 126/72 | HR 102 | Ht 62.0 in | Wt 198.2 lb

## 2024-04-27 DIAGNOSIS — E66812 Obesity, class 2: Secondary | ICD-10-CM

## 2024-04-27 DIAGNOSIS — Z6836 Body mass index (BMI) 36.0-36.9, adult: Secondary | ICD-10-CM | POA: Diagnosis not present

## 2024-04-27 DIAGNOSIS — I10 Essential (primary) hypertension: Secondary | ICD-10-CM | POA: Diagnosis not present

## 2024-04-27 DIAGNOSIS — E669 Obesity, unspecified: Secondary | ICD-10-CM

## 2024-04-27 DIAGNOSIS — Z7689 Persons encountering health services in other specified circumstances: Secondary | ICD-10-CM

## 2024-04-27 MED ORDER — WEGOVY 0.5 MG/0.5ML ~~LOC~~ SOAJ: 0.5000 mg | SUBCUTANEOUS | 1 refills | Status: AC

## 2024-04-27 NOTE — Progress Notes (Signed)
 "  Patient Office Visit  Assessment & Plan:  Obesity (BMI 30-39.9) -     Wegovy ; Inject 0.5 mg into the skin once a week.  Dispense: 2 mL; Refill: 1  Encounter for weight management  Hypertension, unspecified type   Assessment and Plan    Obesity Management with Wegovy  ongoing. Reports constipation, a common side effect. Weight loss achieved. Prefers injectable form over oral medication. - Continue Wegovy  at current dose for two months before considering increase to 0.5 mg. - Start Metamucil for constipation management. - Increase water intake. - Maintain regular exercise and protein intake. - Schedule follow-up in three months to assess progress and tolerance.  Essential hypertension Blood pressure well-controlled, likely due to weight loss and lifestyle modifications. - Continue current management and lifestyle modifications.       We will increase the Wegovy  to 0.5 mg weekly.  Patient will keep us  up-to-date on how she is doing.  We would likely do this for 2 months before increasing to the next dosage.  Patient knows to get adequate protein throughout the day and stay well-hydrated.  Patient will continue with exercise and muscle strength training   Return in about 3 months (around 07/26/2024), or if symptoms worsen or fail to improve.   Subjective:    Patient ID: [name], female    DOB: 1966/02/18  Age: 59 y.o. MRN: [id number]  Chief Complaint  Patient presents with   Medical Management of Chronic Issues    Would like to increase wegovy  to 0.5 (pended).     HPI Discussed the use of AI scribe software for clinical note transcription with the patient, who gave verbal consent to proceed.     History of Present Illness [name] is a 59 year old female who presents for follow-up regarding weight management and medication side effects.  She is experiencing constipation as a side effect of her current medication regimen. Bowel movements have decreased  from twice daily to once daily or sometimes skipping a day, followed by two movements the next day. She has started using Metamucil to manage the constipation.  She has been on a weight management medication, which she takes weekly on Sundays. Her appetite decreases significantly after taking the medication, with hunger returning by Saturday as the medication wears off. She supplements her diet with protein shakes and yogurt to ensure adequate protein intake, especially when she is not feeling hungry.  She has been actively engaged in strength training and cardio exercises at the gym. She reports weight loss and is pleased with the progress. She maintains a routine of drinking approximately 60 ounces of water daily, although she admits to drinking less during a recent three-week break from work. She has not noticed any significant changes in her heart rate, although it tends to be faster naturally.  Assessment and Plan Obesity Management with Wegovy  ongoing. Reports constipation, a common side effect. Weight loss achieved. Prefers injectable form over oral medication. - Continue Wegovy  at current dose for two months before considering increase to 0.5 mg. - Start Metamucil for constipation management. - Increase water intake. - Maintain regular exercise and protein intake. - Schedule follow-up in three months to assess progress and tolerance.  Essential hypertension Blood pressure well-controlled, likely due to weight loss and lifestyle modifications. - Continue current management and lifestyle modifications.  The 10-year ASCVD risk score (Arnett DK, et al., 2019) is: 3.9%  Past Medical History:  Diagnosis Date   Dyslipidemia    Hyperlipidemia  Hypertension    Past Surgical History:  Procedure Laterality Date   ABDOMINAL HYSTERECTOMY  2012   Social History[1] Family History  Problem Relation Age of Onset   Atrial fibrillation Mother    Hypertension Mother    Hyperlipidemia Mother     Multiple myeloma Father    Cancer Father    Rheum arthritis Sister    Hypertension Sister    Cancer Brother        Glio   Kidney disease Son        FSGS   Ovarian cancer Neg Hx    Breast cancer Neg Hx    Colon cancer Neg Hx    Uterine cancer Neg Hx    Allergies[2]  ROS    Objective:    BP 126/72   Pulse (!) 102   Ht 5' 2 (1.575 m)   Wt 198 lb 4 oz (89.9 kg)   SpO2 98%   BMI 36.26 kg/m  BP Readings from Last 3 Encounters:  04/27/24 126/72  02/10/24 (!) 136/90  04/04/22 134/80   Wt Readings from Last 3 Encounters:  04/27/24 198 lb 4 oz (89.9 kg)  02/10/24 206 lb (93.4 kg)  04/04/22 197 lb (89.4 kg)    Physical Exam Vitals and nursing note reviewed.  Constitutional:      General: She is not in acute distress.    Appearance: Normal appearance.  HENT:     Head: Normocephalic.     Right Ear: Tympanic membrane, ear canal and external ear normal.     Left Ear: Tympanic membrane, ear canal and external ear normal.  Eyes:     Extraocular Movements: Extraocular movements intact.     Conjunctiva/sclera: Conjunctivae normal.     Pupils: Pupils are equal, round, and reactive to light.  Cardiovascular:     Rate and Rhythm: Normal rate and regular rhythm.     Heart sounds: Normal heart sounds.  Pulmonary:     Effort: Pulmonary effort is normal.     Breath sounds: Normal breath sounds.  Musculoskeletal:     Right lower leg: No edema.     Left lower leg: No edema.  Neurological:     General: No focal deficit present.     Mental Status: She is alert and oriented to person, place, and time.  Psychiatric:        Mood and Affect: Mood normal.        Behavior: Behavior normal.      No results found for any visits on 04/27/24.          [1]  Social History Tobacco Use   Smoking status: Never   Smokeless tobacco: Never  Vaping Use   Vaping status: Never Used  Substance Use Topics   Alcohol use: Yes   Drug use: No  [2] No Known Allergies  "

## 2024-08-03 ENCOUNTER — Ambulatory Visit: Admitting: Family Medicine
